# Patient Record
Sex: Female | Born: 1975 | Race: Black or African American | Hispanic: No | Marital: Married | Smoking: Never smoker
Health system: Southern US, Community
[De-identification: ages and names within clinical notes are randomized; demographics above are authoritative.]

## PROBLEM LIST (undated history)

## (undated) DIAGNOSIS — E119 Type 2 diabetes mellitus without complications: Secondary | ICD-10-CM

---

## 2018-07-18 ENCOUNTER — Encounter (HOSPITAL_COMMUNITY): Payer: Self-pay

## 2018-07-18 ENCOUNTER — Inpatient Hospital Stay (HOSPITAL_COMMUNITY): Payer: Medicaid Other

## 2018-07-18 ENCOUNTER — Inpatient Hospital Stay (HOSPITAL_COMMUNITY)
Admission: AD | Admit: 2018-07-18 | Discharge: 2018-07-18 | Disposition: A | Discharge status: Home / Self Care | Payer: Medicaid Other | Attending: Obstetrics and Gynecology | Admitting: Obstetrics and Gynecology

## 2018-07-18 DIAGNOSIS — M549 Dorsalgia, unspecified: Secondary | ICD-10-CM | POA: Diagnosis present

## 2018-07-18 DIAGNOSIS — O208 Other hemorrhage in early pregnancy: Secondary | ICD-10-CM | POA: Insufficient documentation

## 2018-07-18 DIAGNOSIS — O468X1 Other antepartum hemorrhage, first trimester: Secondary | ICD-10-CM | POA: Diagnosis not present

## 2018-07-18 DIAGNOSIS — Z3A08 8 weeks gestation of pregnancy: Secondary | ICD-10-CM | POA: Insufficient documentation

## 2018-07-18 DIAGNOSIS — O200 Threatened abortion: Secondary | ICD-10-CM | POA: Diagnosis not present

## 2018-07-18 DIAGNOSIS — O418X10 Other specified disorders of amniotic fluid and membranes, first trimester, not applicable or unspecified: Secondary | ICD-10-CM

## 2018-07-18 DIAGNOSIS — O209 Hemorrhage in early pregnancy, unspecified: Secondary | ICD-10-CM | POA: Diagnosis not present

## 2018-07-18 DIAGNOSIS — O24911 Unspecified diabetes mellitus in pregnancy, first trimester: Secondary | ICD-10-CM | POA: Diagnosis not present

## 2018-07-18 DIAGNOSIS — N939 Abnormal uterine and vaginal bleeding, unspecified: Secondary | ICD-10-CM | POA: Diagnosis present

## 2018-07-18 HISTORY — DX: Type 2 diabetes mellitus without complications: E11.9

## 2018-07-18 LAB — URINALYSIS, ROUTINE W REFLEX MICROSCOPIC
Bacteria, UA: NONE SEEN
Bilirubin Urine: NEGATIVE
Glucose, UA: >=500 mg/dL — AB
KETONES UR: NEGATIVE mg/dL
Nitrite: NEGATIVE
PH: 6 (ref 5.0–8.0)
Protein, ur: 100 mg/dL — AB
Specific Gravity, Urine: 1.023 (ref 1.005–1.030)

## 2018-07-18 LAB — CBC
HEMATOCRIT: 41.7 % (ref 36.0–46.0)
Hemoglobin: 13.8 g/dL (ref 12.0–15.0)
MCH: 28.3 pg (ref 26.0–34.0)
MCHC: 33.1 g/dL (ref 30.0–36.0)
MCV: 85.6 fL (ref 80.0–100.0)
Platelets: 342 10*3/uL (ref 150–400)
RBC: 4.87 MIL/uL (ref 3.87–5.11)
RDW: 13.5 % (ref 11.5–15.5)
WBC: 7.4 10*3/uL (ref 4.0–10.5)
nRBC: 0 % (ref 0.0–0.2)

## 2018-07-18 LAB — WET PREP, GENITAL
Clue Cells Wet Prep HPF POC: NONE SEEN
Sperm: NONE SEEN
Trich, Wet Prep: NONE SEEN
Yeast Wet Prep HPF POC: NONE SEEN

## 2018-07-18 LAB — HCG, QUANTITATIVE, PREGNANCY: hCG, Beta Chain, Quant, S: 14825 m[IU]/mL — ABNORMAL HIGH (ref <5)

## 2018-07-18 LAB — ABO/RH: ABO/RH(D): A POS

## 2018-07-18 LAB — POCT PREGNANCY, URINE: Preg Test, Ur: POSITIVE — AB

## 2018-07-18 NOTE — MAU Provider Note (Signed)
Chief Complaint: Vaginal Bleeding and Back Pain   First Provider Initiated Contact with Patient 07/18/18 1136     SUBJECTIVE HPI: Gina Mcclure is a 42 y.o. [id number] at 7524w5d by unsure LMP who presents to [facility] reporting abdominal cramping & vaginal bleeding. Symptoms began yesterday. Initially was brown spotting but has turned red today. Not saturating pads or passing clots.   Location: abdomen & back Quality: cramping Severity: 5/10 on pain scale Duration: 1 day Timing: intremittent Modifying factors: none Associated signs and symptoms: vaginal bleeding  Past Medical History:  Diagnosis Date  . Diabetes mellitus without complication (HCC)    OB History  Gravida Para Term Preterm AB Living  6 4 4   1 4   SAB TAB Ectopic Multiple Live Births  1            # Outcome Date GA Lbr Len/2nd Weight Sex Delivery Anes PTL Lv  6 Current           5 SAB           4 Term      Vag-Spont     3 Term      Vag-Spont     2 Term      Vag-Spont     1 Term      CS-Unspec      Past Surgical History:  Procedure Laterality Date  . CESAREAN SECTION     Social History   Socioeconomic History  . Marital status: Married    Spouse name: Not on file  . Number of children: Not on file  . Years of education: Not on file  . Highest education level: Not on file  Occupational History  . Not on file  Social Needs  . Financial resource strain: Not on file  . Food insecurity:    Worry: Not on file    Inability: Not on file  . Transportation needs:    Medical: Not on file    Non-medical: Not on file  Tobacco Use  . Smoking status: Never Smoker  . Smokeless tobacco: Never Used  Substance and Sexual Activity  . Alcohol use: Not Currently  . Drug use: Never  . Sexual activity: Not on file  Lifestyle  . Physical activity:    Days per week: Not on file    Minutes per session: Not on file  . Stress: Not on file  Relationships  . Social connections:    Talks on phone: Not on file   Gets together: Not on file    Attends religious service: Not on file    Active member of club or organization: Not on file    Attends meetings of clubs or organizations: Not on file    Relationship status: Not on file  . Intimate partner violence:    Fear of current or ex partner: Not on file    Emotionally abused: Not on file    Physically abused: Not on file    Forced sexual activity: Not on file  Other Topics Concern  . Not on file  Social History Narrative  . Not on file   No family history on file. No current facility-administered medications on file prior to encounter.    No current outpatient medications on file prior to encounter.   No Known Allergies  I have reviewed patient's Past Medical Hx, Surgical Hx, Family Hx, Social Hx, medications and allergies.   Review of Systems  Constitutional: Negative.   Gastrointestinal: Positive for abdominal pain.  Genitourinary: Positive for vaginal bleeding.  Musculoskeletal: Positive for back pain.    OBJECTIVE Patient Vitals for the past 24 hrs:  BP Temp Pulse Resp SpO2  07/18/18 1141 136/88 (!) 97.5 F (36.4 C) 85 20 99 %   Constitutional: Well-developed, well-nourished female in no acute distress.  Cardiovascular: normal rate & rhythm, no murmur Respiratory: normal rate and effort. Lung sounds clear throughout GI: Abd soft, non-tender, Pos BS x 4. No guarding or rebound tenderness MS: Extremities nontender, no edema, normal ROM Neurologic: Alert and oriented x 4.  GU:     SPECULUM EXAM: NEFG, small amount of dark red blood. Cervix pink/smooth.   BIMANUAL: No CMT. cervix closed; uterus normal size, no adnexal tenderness or masses.    LAB RESULTS Results for orders placed or performed during the hospital encounter of 07/18/18 (from the past 24 hour(s))  Urinalysis, Routine w reflex microscopic     Status: Abnormal   Collection Time: 07/18/18 11:24 AM  Result Value Ref Range   Color, Urine RED (A) YELLOW   APPearance  CLOUDY (A) CLEAR   Specific Gravity, Urine 1.023 1.005 - 1.030   pH 6.0 5.0 - 8.0   Glucose, UA >=500 (A) NEGATIVE mg/dL   Hgb urine dipstick LARGE (A) NEGATIVE   Bilirubin Urine NEGATIVE NEGATIVE   Ketones, ur NEGATIVE NEGATIVE mg/dL   Protein, ur 161100 (A) NEGATIVE mg/dL   Nitrite NEGATIVE NEGATIVE   Leukocytes, UA SMALL (A) NEGATIVE   RBC / HPF >50 (H) 0 - 5 RBC/hpf   WBC, UA 0-5 0 - 5 WBC/hpf   Bacteria, UA NONE SEEN NONE SEEN   Squamous Epithelial / LPF 0-5 0 - 5   Mucus PRESENT   Pregnancy, urine POC     Status: Abnormal   Collection Time: 07/18/18 11:27 AM  Result Value Ref Range   Preg Test, Ur POSITIVE (A) NEGATIVE  Wet prep, genital     Status: Abnormal   Collection Time: 07/18/18 11:54 AM  Result Value Ref Range   Yeast Wet Prep HPF POC NONE SEEN NONE SEEN   Trich, Wet Prep NONE SEEN NONE SEEN   Clue Cells Wet Prep HPF POC NONE SEEN NONE SEEN   WBC, Wet Prep HPF POC FEW (A) NONE SEEN   Sperm NONE SEEN   CBC     Status: None   Collection Time: 07/18/18 12:06 PM  Result Value Ref Range   WBC 7.4 4.0 - 10.5 K/uL   RBC 4.87 3.87 - 5.11 MIL/uL   Hemoglobin 13.8 12.0 - 15.0 g/dL   HCT 09.641.7 04.536.0 - 40.946.0 %   MCV 85.6 80.0 - 100.0 fL   MCH 28.3 26.0 - 34.0 pg   MCHC 33.1 30.0 - 36.0 g/dL   RDW 81.113.5 91.411.5 - 78.215.5 %   Platelets 342 150 - 400 K/uL   nRBC 0.0 0.0 - 0.2 %  ABO/Rh     Status: None (Preliminary result)   Collection Time: 07/18/18 12:06 PM  Result Value Ref Range   ABO/RH(D)      A POS Performed at [facility], [address] [place], [place] [id number]   hCG, quantitative, pregnancy     Status: Abnormal   Collection Time: 07/18/18 12:06 PM  Result Value Ref Range   hCG, Beta Chain, Quant, S 14,825 (H) <5 mIU/mL    IMAGING Kore[place] Ob Less Than 14 Weeks With Ob Transvaginal  Result Date: 07/18/2018 CLINICAL DATA:  Pregnant patient with vaginal bleeding. EXAM: OBSTETRIC <14  WK [place] AND TRANSVAGINAL OB US TECHNIQUE: Both transabdominal and transvaginal  ultrasound examinations were performed for complete evaluation of the gestation as well as the maternal uterus, adnexal regions, and pelvic cul-de-sac. Transvaginal technique was performed to assess early pregnancy. COMPARISON:  None. FINDINGS: Intrauterine gestational sac: Single Yolk sac:  Visualized. Embryo:  Visualized. Cardiac Activity: Not Visualized. CRL:  2.9 mm   5 w   5 d Subchorionic hemorrhage:  Small Maternal uterus/adnexae: Normal right and left ovaries. No free fluid in the pelvis. IMPRESSION: Intrauterine gestational sac and fetal pole identified without heartbeat. Crown-rump length of 2.9 mm. Findings are suspicious but not yet definitive for failed pregnancy. Recommend follow-up US in 10-14 days for definitive diagnosis. This recommendation follows SRU consensus guidelines: Diagnostic Criteria for Nonviable Pregnancy Early in the First Trimester. Malva Limes Med 2013; 409:8119-14. Small subchorionic hemorrhage. Electronically Signed   By: Annia Belt M.D.   On: 07/18/2018 13:13    MAU COURSE Orders Placed This Encounter  Procedures  . Wet prep, genital  . US OB LESS THAN 14 WEEKS WITH OB TRANSVAGINAL  . US OB Transvaginal  . Urinalysis, Routine w reflex microscopic  . CBC  . hCG, quantitative, pregnancy  . HIV Antibody (routine testing w rflx)  . Pregnancy, urine POC  . ABO/Rh  . Discharge patient   No orders of the defined types were placed in this encounter.   MDM +UPT UA, wet prep, GC/chlamydia, CBC, ABO/Rh, quant hCG, and [place] today to rule out ectopic pregnancy Ultrasound shows IUP, embryo w/CRL of 2.9 mm & no cardiac activity. Small SCH.  Discussed results with patient & spouse using video Arabic interpreter. Pt doesn't know date of LMP but sure that it was 2 months ago. Concerning for miscarriage but will get f/u ultrasound for viability.   ASSESSMENT 1. Threatened miscarriage   2. Vaginal bleeding in pregnancy, first trimester   3. Subchorionic hematoma in first  trimester, single or unspecified fetus     PLAN Discharge home in stable condition. Bleeding precautions Outpatient f/u ultrasound ordered  Allergies as of 07/18/2018   No Known Allergies     Medication List    You have not been prescribed any medications.      Judeth Horn, NP 07/18/2018  1:55 PM

## 2018-07-18 NOTE — MAU Note (Signed)
Reports she started bleeding yesterday, now it is brown. Reports it is just a little bit  Reports some back pain  LMP unknown, thinks she is 2 months pregnant

## 2018-07-18 NOTE — Discharge Instructions (Signed)
Subchorionic Hematoma ° °A subchorionic hematoma is a gathering of blood between the outer wall of the embryo (chorion) and the inner wall of the womb (uterus). °This condition can cause vaginal bleeding. If they cause little or no vaginal bleeding, early small hematomas usually shrink on their own and do not affect your baby or pregnancy. When bleeding starts later in pregnancy, or if the hematoma is larger or occurs in older pregnant women, the condition may be more serious. Larger hematomas may get bigger, which increases the chances of miscarriage. This condition also increases the risk of: °· Premature separation of the placenta from the uterus. °· Premature (preterm) labor. °· Stillbirth. °What are the causes? °The exact cause of this condition is not known. It occurs when blood is trapped between the placenta and the uterine wall because the placenta has separated from the original site of implantation. °What increases the risk? °You are more likely to develop this condition if: °· You were treated with fertility medicines. °· You conceived through in vitro fertilization (IVF). °What are the signs or symptoms? °Symptoms of this condition include: °· Vaginal spotting or bleeding. °· Contractions of the uterus. These cause abdominal pain. °Sometimes you may have no symptoms and the bleeding may only be seen when ultrasound images are taken (transvaginal ultrasound). °How is this diagnosed? °This condition is diagnosed based on a physical exam. This includes a pelvic exam. You may also have other tests, including: °· Blood tests. °· Urine tests. °· Ultrasound of the abdomen. °How is this treated? °Treatment for this condition can vary. Treatment may include: °· Watchful waiting. You will be monitored closely for any changes in bleeding. During this stage: °? The hematoma may be reabsorbed by the body. °? The hematoma may separate the fluid-filled space containing the embryo (gestational sac) from the wall of the  womb (endometrium). °· Medicines. °· Activity restriction. This may be needed until the bleeding stops. °Follow these instructions at home: °· Stay on bed rest if told to do so by your health care provider. °· Do not lift anything that is heavier than 10 lbs. (4.5 kg) or as told by your health care provider. °· Do not use any products that contain nicotine or tobacco, such as cigarettes and e-cigarettes. If you need help quitting, ask your health care provider. °· Track and write down the number of pads you use each day and how soaked (saturated) they are. °· Do not use tampons. °· Keep all follow-up visits as told by your health care provider. This is important. Your health care provider may ask you to have follow-up blood tests or ultrasound tests or both. °Contact a health care provider if: °· You have any vaginal bleeding. °· You have a fever. °Get help right away if: °· You have severe cramps in your stomach, back, abdomen, or pelvis. °· You pass large clots or tissue. Save any tissue for your health care provider to look at. °· You have more vaginal bleeding, and you faint or become lightheaded or weak. °Summary °· A subchorionic hematoma is a gathering of blood between the outer wall of the placenta and the uterus. °· This condition can cause vaginal bleeding. °· Sometimes you may have no symptoms and the bleeding may only be seen when ultrasound images are taken. °· Treatment may include watchful waiting, medicines, or activity restriction. °This information is not intended to replace advice given to you by your health care provider. Make sure you discuss any questions you   have with your health care provider. °Document Released: 10/22/2006 Document Revised: 09/02/2016 Document Reviewed: 09/02/2016 °Elsevier Interactive Patient Education © 2019 Elsevier Inc. °Threatened Miscarriage ° °A threatened miscarriage occurs when a woman has vaginal bleeding during the first 20 weeks of pregnancy but the pregnancy has  not ended. If you have vaginal bleeding during this time, your health care provider will do tests to make sure you are still pregnant. If the tests show that you are still pregnant and that the developing baby (fetus) inside your uterus is still growing, your condition is considered a threatened miscarriage. °A threatened miscarriage does not mean your pregnancy will end, but it does increase the risk of losing your pregnancy (complete miscarriage). °What are the causes? °The cause of this condition is usually not known. For women who go on to have a complete miscarriage, the most common cause is an abnormal number of chromosomes in the developing baby. Chromosomes are the structures inside cells that hold all of a person's genetic material. °What increases the risk? °The following lifestyle factors may increase your risk of a miscarriage in early pregnancy: °· Smoking. °· Drinking excessive amounts of alcohol or caffeine. °· Recreational drug use. °The following preexisting health conditions may increase your risk of a miscarriage in early pregnancy: °· Polycystic ovary syndrome. °· Uterine fibroids. °· Infections. °· Diabetes mellitus. °What are the signs or symptoms? °Symptoms of this condition include: °· Vaginal bleeding. °· Mild abdominal pain or cramps. °How is this diagnosed? °If you have bleeding with or without abdominal pain before 20 weeks of pregnancy, your health care provider will do tests to check whether you are still pregnant. These will include: °· Ultrasound. This test uses sound waves to create images of the inside of your uterus. This allows your health care provider to look at your developing baby and other structures, such as your placenta. °· Pelvic exam. This is an internal exam of your vagina and cervix. °· Measurement of your baby's heart rate. °· Laboratory tests such as blood tests, urine tests, or swabs for infection °You may be diagnosed with a threatened miscarriage if: °· Ultrasound  testing shows that you are still pregnant. °· Your baby’s heart rate is strong. °· A pelvic exam shows that the opening between your uterus and your vagina (cervix) is closed. °· Blood tests confirm that you are still pregnant. °How is this treated? °No treatments have been shown to prevent a threatened miscarriage from going on to a complete miscarriage. However, the right home care is important. °Follow these instructions at home: °· Get plenty of rest. °· Do not have sex or use tampons if you have vaginal bleeding. °· Do not douche. °· Do not smoke or use recreational drugs. °· Do not drink alcohol. °· Avoid caffeine. °· Keep all follow-up prenatal visits as told by your health care provider. This is important. °Contact a health care provider if: °· You have light vaginal bleeding or spotting while pregnant. °· You have abdominal pain or cramping. °· You have a fever. °Get help right away if: °· You have heavy vaginal bleeding. °· You have blood clots coming from your vagina. °· You pass tissue from your vagina. °· You leak fluid, or you have a gush of fluid from your vagina. °· You have severe low back pain or abdominal cramps. °· You have fever, chills, and severe abdominal pain. °Summary °· A threatened miscarriage occurs when a woman has vaginal bleeding during the first 20 weeks of   pregnancy but the pregnancy has not ended. °· The cause of a threatened miscarriage is usually not known. °· Symptoms of this condition may include vaginal bleeding and mild abdominal pain or cramps. °· No treatments have been shown to prevent a threatened miscarriage from going on to a complete miscarriage. °· Keep all follow-up prenatal visits as told by your health care provider. This is important. °This information is not intended to replace advice given to you by your health care provider. Make sure you discuss any questions you have with your health care provider. °Document Released: 07/07/2005 Document Revised: 10/03/2016  Document Reviewed: 10/03/2016 °Elsevier Interactive Patient Education © 2019 Elsevier Inc. ° °

## 2018-07-19 LAB — GC/CHLAMYDIA PROBE AMP (~~LOC~~) NOT AT ARMC
Chlamydia: NEGATIVE
Neisseria Gonorrhea: NEGATIVE

## 2018-07-19 LAB — HIV ANTIBODY (ROUTINE TESTING W REFLEX): HIV Screen 4th Generation wRfx: NONREACTIVE

## 2018-07-24 ENCOUNTER — Inpatient Hospital Stay (HOSPITAL_COMMUNITY)
Admission: AD | Admit: 2018-07-24 | Discharge: 2018-07-24 | Disposition: A | Discharge status: Home / Self Care | Payer: Medicaid Other | Attending: Obstetrics & Gynecology | Admitting: Obstetrics & Gynecology

## 2018-07-24 ENCOUNTER — Inpatient Hospital Stay (HOSPITAL_COMMUNITY): Payer: Medicaid Other

## 2018-07-24 ENCOUNTER — Encounter (HOSPITAL_COMMUNITY): Payer: Self-pay | Admitting: *Deleted

## 2018-07-24 DIAGNOSIS — Z3A09 9 weeks gestation of pregnancy: Secondary | ICD-10-CM | POA: Diagnosis not present

## 2018-07-24 DIAGNOSIS — O209 Hemorrhage in early pregnancy, unspecified: Secondary | ICD-10-CM | POA: Diagnosis not present

## 2018-07-24 DIAGNOSIS — O034 Incomplete spontaneous abortion without complication: Secondary | ICD-10-CM | POA: Diagnosis not present

## 2018-07-24 DIAGNOSIS — O208 Other hemorrhage in early pregnancy: Secondary | ICD-10-CM | POA: Diagnosis present

## 2018-07-24 LAB — CBC
HCT: 38.5 % (ref 36.0–46.0)
Hemoglobin: 12.7 g/dL (ref 12.0–15.0)
MCH: 28.5 pg (ref 26.0–34.0)
MCHC: 33 g/dL (ref 30.0–36.0)
MCV: 86.3 fL (ref 80.0–100.0)
Platelets: 311 10*3/uL (ref 150–400)
RBC: 4.46 MIL/uL (ref 3.87–5.11)
RDW: 13.2 % (ref 11.5–15.5)
WBC: 6.4 10*3/uL (ref 4.0–10.5)
nRBC: 0 % (ref 0.0–0.2)

## 2018-07-24 LAB — HCG, QUANTITATIVE, PREGNANCY: hCG, Beta Chain, Quant, S: 5910 m[IU]/mL — ABNORMAL HIGH (ref <5)

## 2018-07-24 MED ORDER — PROMETHAZINE HCL 25 MG PO TABS: 12.5000 mg | ORAL_TABLET | Freq: Four times a day (QID) | ORAL | 0 refills | Status: AC | PRN

## 2018-07-24 MED ORDER — TRAMADOL HCL 50 MG PO TABS: 50.0000 mg | ORAL_TABLET | Freq: Four times a day (QID) | ORAL | 0 refills | Status: AC | PRN

## 2018-07-24 MED ORDER — MISOPROSTOL 200 MCG PO TABS
800.0000 ug | ORAL_TABLET | Freq: Once | ORAL | Status: AC
  Administered 2018-07-24: 800 ug via BUCCAL
  Filled 2018-07-24: qty 4

## 2018-07-24 NOTE — MAU Note (Addendum)
Gina Mcclure is a 43 y.o. at 4282w4d here in MAU reporting: +vaginal bleeding. Same as a period. Has continued since being seen. Was told to come back if it worsened. Onset of complaint: since 12/29 visit. Pain score: denies.  Vitals:   07/24/18 1109  BP: 112/80  Pulse: 85  Resp: 18  Temp: 98.3 F (36.8 C)  SpO2: 100%    Lab orders placed from triage: ua  Video interpretor used; Arabic: Erenu [phone]

## 2018-07-24 NOTE — Discharge Instructions (Signed)
()     .             ( )  .          .           .      .                          .        :              .                       ( ).     .        .                 (  ).                          ( ).   .       .  ().         .         :            .        .         .           :      .              :   (D&C).               ( )      .   : medicine    . ?         . ?   ()  .          Marland Kitchen       Rh     Rh        Rh immunoglobulinto         Rh.   "Rh-negative"  "Rh-positive"               .     :    -               .                   Marland Kitchen           .               .      .    .          .            .            ().   . -           .        .                .                      .                               .          .  .          :  [email or web address]          : [email or web address]     :    .     .      :         .         ( )  .              .     .  Marland Kitchen              .              ( )  .                  .          .  'iijhad ghyr kamil al'iijhad hu fiqdan aljiniyn (aljnyn) qabl al'usbue aleishrin min alhaml. fi halat 'iijhad ghyr muktamalat , tabqaa 'ajza' min aljiniyn 'aw almushima (bead alwiladata) fi aljism. tahadath mezm halat al'iijhad fi al'ashhur althlatht al'uwlaa min alhumli. yahduth dhlk fi bed al'ahyan qabl 'an tuearif almar'at 'anaha hamil. ymkn 'an yakun al'iijhad tajribat eatifiatin. 'iidha kunt qad taearadat lil'iijhad , fatahadath mae muqadim alrieayat alsihiyat alkhasi bik ean 'ayi 'asyilat qad takun ladaykum hawl al'iijhad waeamaliat alhuzn wakhutat alhaml almustaqbaliat. ma hi al'asbaba? qad yakun sabab hadhih alhalata:  mashakil aljayinat 'aw alkurumusumat alty tajeal min almustahil ealaa altifl altatawur bishakl tabiei. ghalbana ma takun hadhih almushkilat natijatan li'akhta' eushwayiyat tahadath fi marhalat mubakirat min alnumui , wala ytmu naqluha min al'ab 'iilaa altifl (ghyir almawruthat). 'iisabatan eunq alrahim 'aw alruhm. alhalat alty tuathir ealaa tawazun alhirmunat fi aljism.  mashakil fi eunq alrahim , mithl fath eunq alrahim waraqiqih qabl alhaml hi fi nihayatiha (qsuwr einq alrahm).  mashakil fi alrahim , mithl alrahim dhi alshakl ghyr altabieii , 'aw al'awram alliyfiat fi alrahim , 'aw almashakil alty kanat mawjudatan mundh alwilada (altashawuhat alkhalqiata). bed alhalat altabiyati. altadakhiyn 'aw shurb alkuhul 'aw taeati almukhdirat. 'iisaba (sdimat). fi kthyr min al'ahyan , sbb al'iijhad ghyr maeruf. ma hi alealamat 'aw al'aerad? tashmal 'aerad hadhih alhalat: nazif muhbiliun 'aw tahdid Reatha Armour 'aw bidun tashanujat 'aw 'alm. 'alam 'aw tashnuj fi albatn 'aw 'asfal alzuhra. murur alsawayil 'aw al'ansajat 'aw jultat aldam min  almahbil. kayf ytmu tashkhis hdha? qad yatim tashkhis hadhih alhalat bna'an ealaa: alfahs albadaniu  almawajaat fawq alsawtiat. aikhtibarat aldam aikhtibarat albawl kayf ytmu eilaj hdha? yumkin eilaj al'iijhad ghyr almuktamal b: altamadud walkushut (D&C). hadha hu al'iijra' aldhy ytmu fih fath eunq alrahim , wayutimu kasht bitanat alrahim (btanat alrahma) li'iizalat 'ayi nasij mtbq min alhaml. al'adwiat , mthl: medicine mdadat hayawiat liealaj aleadwaa. ? dawa' limusaeadat 'ayi nasij mtbq ealaa alkhuruj min alruhm. ? dawa' likhafd (tqls) hajm alrahm. yumkin 'iieta' hadhih al'adwiat 'iidha kan ladayk alkthyr min alnazif. 'iidha kan ladayk dam salbi bsbb Rh wakan tiflak 'iyjabyana fi Rh , fisitihtaj 'iilaa jureatan min aldiwa' tusamaa Rh immunoglobulinto tahmi al'atfal fi almustaqbal min mashakil dam Rh. yushir almustalah "Rh-negative" w "Rh-positive" 'iilaa ma 'iidha kan lildam burwatayn muhadad mawjud ealaa sath khalaya aldam alhamra' 'am la. atabie hadhih altaelimat fi almanzil: al'adwia - tanawul al'adwiat alty tasrif bidun wasfat tibiyat wal'adwiat almawsufat faqat kama qal muqadim alrieayat alsahiyat. 'iidha kunt tatanawal dwa'an mdadana lilmadadat alhayawiat , khudha almadadi alhayawiu alkhasi bik wfqana lma qalah mufir alrieayat alsihiati. la tatawaqaf ean tanawul almidadaat alhayawiat hataa law bada'at tasheur bialtahsun. la takhudh mdadat alailtihab ghyr alsitirawyiydiat , mithl al'asbarin wal'iibubirufin , 'illa 'iidha wafaq tabibik. hadhih al'adwiat ymkn 'an tusabib alnazif. nashat alrrahat hsb altawjihat. ais'al muqadim alrieayat  alsihiyat alkhasi bik ean al'anshitat alaminat lika.  'atlub musaeadat shakhs ma fi maswuwaliaat almanzil wal'usrat khilal hdha alwaqt. taelimat eama tatabie eadad alfawt alsihiyat alty tastakhdimuha ywmyana wamadaa ghamariha (almashbeata). aiktub hadhih almaelumat. - raqib kamiyat al'ansijat 'aw jultat aldam alty tamuru biha min almahbili. qum bihafz 'ayi kamiyat kabirat min  al'ansajat lifahsiha. la tastakhdim hafayiz , nadah , 'aw mumarasat aljins hataa ywafq muqadim alrieayat alsihiyat alkhasi bika.  limusaeadatik washarikik fi eamaliat alhuzn , tahadath mae muqadim alrieayat alsihiyat 'aw 'abhath ean almashurat lilmusaeadat fi altaghalub ealaa fiqdan alhaml. eindama Janeece Fittingtakun mstedana , aijtamae mae muqadim alrieayat alsihiyat alkhasi bik limunaqashat alkhutuat almuhimat alty yjb ealayk aitikhadhuha lisuhtik , wakadhlik alkhutwat alati yjbu aitikhadhuha min ajl alhaml alsihiyi fi almustaqbal. aihtafaz bijmie ziarat almutabaeat wfqana lma qalah muqadim alrieayat alsahiati. hdha muhim. 'ayn tajid almazid min almaelumat almutamar al'amrikiu li'atbaa' alnisa' waltawlid: www.[email or web address]  alwilayat almutahidat wizarat alsihat walkhadamat al'iinsaniat maktab sihat almara'at: [email or web address] 'atasil bimuzud alrieayat alsihiyat 'idha: ladayk humaa 'aw qasheariratan. ladayk 'iifrazat muhbaliat karihat alraayihat. ahsil ealaa almusaeadat ealaa alfawr 'idha: ladayk tashnajat 'aw 'alam shadid fi alzuhr 'aw albatna. 'ant tamarir julatat damawiatan 'aw 'ansijat bihajm aljawz (aw 'akbara) min muhbalik.  ladayk nazif hadun , tanaqie 'akthar min wasadat sihiyat muntazimat fi saeat wahidatin. 'ant tusbih Clover Mealymanaratan 'aw daeifatan. 'ant tamut.  ladayk mashaeir hazn tastahwidh ealaa 'afkarik , 'aw ladayk 'afkar tudhi nafsik. malkhas fi halat 'iijhad ghyr muktamalat , tabqaa 'ajza' min aljinin 'aw almushima (bead alwiladat) fi aljism.  tujad khiarat mutaeadidat lieilaj al'iijhad ghyr almuktamal , waltahaduth 'iilaa muqadim alrieayat alsihiyat ean alkhiar al'afdal lika.  atabae taelimat muqadim alrieayat alsihiyat alkhasat bik lirieayat almutabaeat.

## 2018-07-24 NOTE — MAU Provider Note (Signed)
Chief Complaint: Vaginal Bleeding   First Provider Initiated Contact with Patient 07/24/18 1153      SUBJECTIVE HPI: Gina Mcclure is a 43 y.o. [id number] at [redacted]w[redacted]d by LMP who presents to maternity admissions reporting increased vaginal bleeding since her MAU visit on 07/18/18. She presented on 07/18/18 with abdominal cramping and bleeding. She had quant hcg of [id number] and [place] that showed IUP with fetal pole at [redacted]w[redacted]d with no visible FHR.  She has outpatient Korea on 07/30/18 for viability.  Her cramping has continued, mild lower abdominal cramping that has not required treatment. Her bleeding was scant but is now like a light period, requiring a pad, changed 4-5 times per day.  There are no other symptoms. She has not tried any treatments.    HPI  Past Medical History:  Diagnosis Date  . Diabetes mellitus without complication [facility])    Past Surgical History:  Procedure Laterality Date  . CESAREAN SECTION     Social History   Socioeconomic History  . Marital status: Married    Spouse name: Not on file  . Number of children: Not on file  . Years of education: Not on file  . Highest education level: Not on file  Occupational History  . Not on file  Social Needs  . Financial resource strain: Not on file  . Food insecurity:    Worry: Not on file    Inability: Not on file  . Transportation needs:    Medical: Not on file    Non-medical: Not on file  Tobacco Use  . Smoking status: Never Smoker  . Smokeless tobacco: Never Used  Substance and Sexual Activity  . Alcohol use: Not Currently  . Drug use: Never  . Sexual activity: Not on file  Lifestyle  . Physical activity:    Days per week: Not on file    Minutes per session: Not on file  . Stress: Not on file  Relationships  . Social connections:    Talks on phone: Not on file    Gets together: Not on file    Attends religious service: Not on file    Active member of club or organization: Not on file    Attends meetings of clubs or  organizations: Not on file    Relationship status: Not on file  . Intimate partner violence:    Fear of current or ex partner: Not on file    Emotionally abused: Not on file    Physically abused: Not on file    Forced sexual activity: Not on file  Other Topics Concern  . Not on file  Social History Narrative  . Not on file   No current facility-administered medications on file prior to encounter.    No current outpatient medications on file prior to encounter.   No Known Allergies  ROS:  Review of Systems  Constitutional: Negative for chills, fatigue and fever.  Respiratory: Negative for shortness of breath.   Cardiovascular: Negative for chest pain.  Gastrointestinal: Positive for abdominal pain. Negative for nausea and vomiting.  Genitourinary: Positive for pelvic pain and vaginal bleeding. Negative for difficulty urinating, dysuria, flank pain, vaginal discharge and vaginal pain.  Neurological: Negative for dizziness and headaches.  Psychiatric/Behavioral: Negative.      I have reviewed patient's Past Medical Hx, Surgical Hx, Family Hx, Social Hx, medications and allergies.   Physical Exam   Patient Vitals for the past 24 hrs:  BP Temp Temp src Pulse Resp SpO2 Weight  07/24/18  1109 112/80 98.3 F (36.8 C) Oral 85 18 100 % 103 kg   Constitutional: Well-developed, well-nourished female in no acute distress.  Cardiovascular: normal rate Respiratory: normal effort GI: Abd soft, non-tender. Pos BS x 4 MS: Extremities nontender, no edema, normal ROM Neurologic: Alert and oriented x 4.  GU: Neg CVAT.  PELVIC EXAM: Deferred    LAB RESULTS Results for orders placed or performed during the hospital encounter of 07/24/18 (from the past 24 hour(s))  CBC     Status: None   Collection Time: 07/24/18 12:15 PM  Result Value Ref Range   WBC 6.4 4.0 - 10.5 K/uL   RBC 4.46 3.87 - 5.11 MIL/uL   Hemoglobin 12.7 12.0 - 15.0 g/dL   HCT 16.138.5 09.636.0 - 04.546.0 %   MCV 86.3 80.0 -  100.0 fL   MCH 28.5 26.0 - 34.0 pg   MCHC 33.0 30.0 - 36.0 g/dL   RDW 40.913.2 81.111.5 - 91.415.5 %   Platelets 311 150 - 400 K/uL   nRBC 0.0 0.0 - 0.2 %  hCG, quantitative, pregnancy     Status: Abnormal   Collection Time: 07/24/18 12:15 PM  Result Value Ref Range   hCG, Beta Chain, Quant, S 5,910 (H) <5 mIU/mL    --/--/A POS Performed at [facility], [address] [place], [place] [address]  (12/29 1206)  IMAGING Koreas Ob Transvaginal  Result Date: 07/24/2018 CLINICAL DATA:  Vaginal bleeding, early pregnancy EXAM: TRANSVAGINAL OB ULTRASOUND TECHNIQUE: Transvaginal ultrasound was performed for complete evaluation of the gestation as well as the maternal uterus, adnexal regions, and pelvic cul-de-sac. COMPARISON:  07/18/2018 FINDINGS: Intrauterine gestational sac: Absent Yolk sac:  Absent Embryo:  Absent Cardiac Activity: Absent Heart Rate: N/A bpm Mixed echogenicity material along the endometrium primarily compatible with blood products. Small amount of accentuated vascularity along some of the endometrial contents. These contents bulge down into the cervix. Maternal uterus/adnexae: The maternal adnexa could not be visualized as the patient was in considerable pain/discomfort which limited today's scan. IMPRESSION: 1. No recognizable gestational sac. Considerable mixed echogenicity material including hyperechoic material within the endometrium compatible with blood products. Overall appearance is not compatible with a viable pregnancy and represents spontaneous abortion in progress. Electronically Signed   By: Gaylyn RongWalter  Liebkemann M.D.   On: 07/24/2018 13:05   Koreas Ob Less Than 14 Weeks With Ob Transvaginal  Result Date: 07/18/2018 CLINICAL DATA:  Pregnant patient with vaginal bleeding. EXAM: OBSTETRIC <14 WK US AND TRANSVAGINAL OB US TECHNIQUE: Both transabdominal and transvaginal ultrasound examinations were performed for complete evaluation of the gestation as well as the maternal uterus, adnexal  regions, and pelvic cul-de-sac. Transvaginal technique was performed to assess early pregnancy. COMPARISON:  None. FINDINGS: Intrauterine gestational sac: Single Yolk sac:  Visualized. Embryo:  Visualized. Cardiac Activity: Not Visualized. CRL:  2.9 mm   5 w   5 d Subchorionic hemorrhage:  Small Maternal uterus/adnexae: Normal right and left ovaries. No free fluid in the pelvis. IMPRESSION: Intrauterine gestational sac and fetal pole identified without heartbeat. Crown-rump length of 2.9 mm. Findings are suspicious but not yet definitive for failed pregnancy. Recommend follow-up US in 10-14 days for definitive diagnosis. This recommendation follows SRU consensus guidelines: Diagnostic Criteria for Nonviable Pregnancy Early in the First Trimester. Malva Limes Engl J Med 2013; 621:3086-57; 369:1443-51. Small subchorionic hemorrhage. Electronically Signed   By: Annia Beltrew  Davis M.D.   On: 07/18/2018 13:13    MAU Management/MDM: Hcg level dropped from 14,825  to 5,910 and US confirms  that the IUP visible on 12/29 is no longer present but there are likely POCs in the uterus.  Discussed options for management including expectant management, Cytotec, and D&C.  Arabic interpreter via video line used for all communication.  Pt and her husband prefer Cytotec now. They would prefer the dose be given in MAU today vs picking up the medication at the pharmacy.  Cytotec 800 buccal dose given.  Rx for Tramadol and Phenergan PRN.  F/u in [facility] office in 1 week for hcg and 2 weeks for provider visit.  Return to MAU with heavy bleeding or severe pain.  Pt discharged with strict return precautions.  Maternity Admissions Unit Cytotec for Early Pregnancy Failure Checklist  Baseline Labs and Diagnostics . CBC . Ultrasound  . Quantitative BHCG . ABO/Rh  Standard Methods of Cytotec Administration:  MAU provider will prescribed ONE method o Cytotec 800 mcg: intravaginal (preferred)  to be administered by patient at home  o Cytotec 800 mcg:  intravaginal (preferred)  to be administered by RN while in MAU o Cytotec 800 mcg: rectally to be administered by patient at home o Cytotec 800 mcg: rectally  to be administered by RN while in MAU o Cytotec 600 mcg: orally to be administered by RN while in MAU o Cytotec 600 mcg: orally to be administered by patient at home  Additional Medications  o Ibuprofen  o Percocet or Vicodin o Phenergan o Rhophylac if indicated  MAU provider Patient counseling regarding all appropriate treatment options  MAU provider Assure patient is reliable for self-administration and/or return for unexpected bleeding, pain, and follow up visit  MAU provider Patient counseling regarding Cytotec expected action, self-administration, expected bleeding and discomfort, side effects.  Instruct patient if bleeding has not occurred in 12 hours another dose may be repeated at home (max 1600 mcg/24hrs).  If patient still has not bled within one week instruct them to call the clinic for repeat RX - [phone]  MAU provider Instructions for problems/emergencies prior to GYN clinic appointment  MAU provider Patient education on pain medication, anti-inflammatories and/or anti-emetics  MAU provider if Patient will self-administer Obtain informed consent for "Self-administration of Cytotec for management of early pregnancy failure"   MAU if RN to administer Obtain informed consent for "Administration of Cytotec for management of early pregnancy failure"  MAU provider Order Rhophylac as indicated  MAU provider Communicate with GYN clinic for 2-week follow up visit   MAU RN Witness informed consent  MAU RN Administer Cytotec as ordered (if patient does not want to self-administer Cytotec)  MAU RN Administer Rhophylac as ordered  MAU RN Use Teach Back-Ask Me 3-- Cytotec actions, bleeding, pain, follow-up, when to repeat cytotec, when to call clinic, and when to return to MAU prior to GYN clinic visit    ASSESSMENT 1. Vaginal  bleeding in pregnancy, first trimester   2. Incomplete miscarriage     PLAN Discharge home Allergies as of 07/24/2018   No Known Allergies     Medication List    TAKE these medications   promethazine 25 MG tablet Commonly known as:  PHENERGAN Take 0.5-1 tablets (12.5-25 mg total) by mouth every 6 (six) hours as needed for nausea.   traMADol 50 MG tablet Commonly known as:  ULTRAM Take 1 tablet (50 mg total) by mouth every 6 (six) hours as needed.      Follow-up Information    [facility].   Specialty:  Obstetrics and Gynecology Why:  The office will call you for labs in 1 week and visit with doctor/midwife in 2 weeks. Return to MAU for emergencies. Contact information: [address] [place] [id number] [phone]          Sharen CounterLisa Leftwich-Kirby Certified Nurse-Midwife 07/24/2018  2:39 PM

## 2018-07-29 ENCOUNTER — Telehealth: Payer: Self-pay | Admitting: Obstetrics and Gynecology

## 2018-07-29 NOTE — Telephone Encounter (Signed)
I called the patient to setup the appointment however there was no option to leave a voicemail and the patient did not answer. I made 3 call attempts with the interrupter. The appointments are scheduled and letter reminders are mailed.

## 2018-07-30 ENCOUNTER — Ambulatory Visit (HOSPITAL_COMMUNITY): Payer: Medicaid Other | Attending: Student

## 2018-07-30 ENCOUNTER — Other Ambulatory Visit: Payer: Self-pay | Admitting: *Deleted

## 2018-07-30 DIAGNOSIS — O034 Incomplete spontaneous abortion without complication: Secondary | ICD-10-CM

## 2018-08-02 ENCOUNTER — Other Ambulatory Visit: Payer: Medicaid Other

## 2018-08-09 ENCOUNTER — Ambulatory Visit: Payer: Medicaid Other | Admitting: Obstetrics & Gynecology

## 2018-08-27 ENCOUNTER — Ambulatory Visit: Payer: Medicaid Other | Admitting: Advanced Practice Midwife

## 2018-10-17 ENCOUNTER — Emergency Department (HOSPITAL_COMMUNITY)
Admission: EM | Admit: 2018-10-17 | Discharge: 2018-10-17 | Disposition: A | Discharge status: Home / Self Care | Payer: Medicaid Other | Attending: Emergency Medicine | Admitting: Emergency Medicine

## 2018-10-17 ENCOUNTER — Other Ambulatory Visit: Payer: Self-pay

## 2018-10-17 ENCOUNTER — Encounter: Payer: Self-pay | Admitting: Emergency Medicine

## 2018-10-17 DIAGNOSIS — N939 Abnormal uterine and vaginal bleeding, unspecified: Secondary | ICD-10-CM | POA: Diagnosis not present

## 2018-10-17 DIAGNOSIS — E119 Type 2 diabetes mellitus without complications: Secondary | ICD-10-CM | POA: Insufficient documentation

## 2018-10-17 DIAGNOSIS — Z760 Encounter for issue of repeat prescription: Secondary | ICD-10-CM | POA: Insufficient documentation

## 2018-10-17 DIAGNOSIS — Z789 Other specified health status: Secondary | ICD-10-CM

## 2018-10-17 LAB — CBC
HCT: 35.1 % — ABNORMAL LOW (ref 36.0–46.0)
Hemoglobin: 10.6 g/dL — ABNORMAL LOW (ref 12.0–15.0)
MCH: 25.4 pg — ABNORMAL LOW (ref 26.0–34.0)
MCHC: 30.2 g/dL (ref 30.0–36.0)
MCV: 84.2 fL (ref 80.0–100.0)
NRBC: 0 % (ref 0.0–0.2)
Platelets: 367 10*3/uL (ref 150–400)
RBC: 4.17 MIL/uL (ref 3.87–5.11)
RDW: 12.2 % (ref 11.5–15.5)
WBC: 6.3 10*3/uL (ref 4.0–10.5)

## 2018-10-17 LAB — I-STAT BETA HCG BLOOD, ED (MC, WL, AP ONLY): I-stat hCG, quantitative: <5 m[IU]/mL (ref <5)

## 2018-10-17 LAB — CBG MONITORING, ED: Glucose-Capillary: 133 mg/dL — ABNORMAL HIGH (ref 70–99)

## 2018-10-17 MED ORDER — FERROUS SULFATE 325 (65 FE) MG PO TABS: 325.0000 mg | ORAL_TABLET | Freq: Every day | ORAL | 0 refills | Status: AC

## 2018-10-17 MED ORDER — METFORMIN HCL 500 MG PO TABS: 500.0000 mg | ORAL_TABLET | Freq: Two times a day (BID) | ORAL | 2 refills | Status: AC

## 2018-10-17 NOTE — ED Notes (Signed)
Called pt husband. Dr Rosalia Hammers is speaking with him at this time.

## 2018-10-17 NOTE — ED Provider Notes (Signed)
[facility] EMERGENCY DEPARTMENT Provider Note   CSN: [id number] Arrival date & time: 10/17/18  1132    History   Chief Complaint Chief Complaint  Patient presents with  . Vaginal Bleeding  . Medication Refill    HPI Gina Mcclure is a 43 y.o. female.     HPI Level 5 caveat secondary to language barrier History obtained through interpreter 43 year old female presents today complaining of being out of medications.  She is takes medication for diabetes 500 mg of oral medication.  She is unable to tell me the name.  She denies any symptoms of frequency of urination, frequent thirst or hunger, chest pain, dyspnea, fever, cough, chills.  She denies any recent travel or exposure to carbon 19 patients.  She also states that she has had some ongoing vaginal bleeding since a miscarriage 3 months ago.  She describes it as being approximately 10 days/month without heavy bleeding.  She denies any associated lightheadedness or weakness.  She denies any pelvic pain or abnormal vaginal discharge.  She has not followed up with her primary care or gynecologist.  Past Medical History:  Diagnosis Date  . Diabetes mellitus without complication (HCC)     There are no active problems to display for this patient.   Past Surgical History:  Procedure Laterality Date  . CESAREAN SECTION       OB History    Gravida  6   Para  4   Term  4   Preterm      AB  1   Living  4     SAB  1   TAB      Ectopic      Multiple      Live Births               Home Medications    Prior to Admission medications   Medication Sig Start Date End Date Taking? Authorizing Provider  promethazine (PHENERGAN) 25 MG tablet Take 0.5-1 tablets (12.5-25 mg total) by mouth every 6 (six) hours as needed for nausea. 07/24/18   Leftwich-Kirby, Wilmer Floor, CNM  traMADol (ULTRAM) 50 MG tablet Take 1 tablet (50 mg total) by mouth every 6 (six) hours as needed. 07/24/18   Leftwich-Kirby, Wilmer Floor, CNM     Family History History reviewed. No pertinent family history.  Social History Social History   Tobacco Use  . Smoking status: Never Smoker  . Smokeless tobacco: Never Used  Substance Use Topics  . Alcohol use: Not Currently  . Drug use: Never     Allergies   Patient has no known allergies.   Review of Systems Review of Systems  All other systems reviewed and are negative.    Physical Exam Updated Vital Signs BP 134/87   Pulse 84   Temp 98.7 F (37.1 C) (Oral)   Resp 14   LMP 05/08/2018 (Approximate)   SpO2 99%   Breastfeeding Unknown   Physical Exam Vitals signs and nursing note reviewed.  Constitutional:      Appearance: Normal appearance.  HENT:     Head: Normocephalic and atraumatic.     Right Ear: External ear normal.     Left Ear: External ear normal.     Nose: Nose normal.     Mouth/Throat:     Mouth: Mucous membranes are moist.     Pharynx: Oropharynx is clear.  Eyes:     Pupils: Pupils are equal, round, and reactive to light.  Neck:  Musculoskeletal: Normal range of motion.  Cardiovascular:     Rate and Rhythm: Normal rate and regular rhythm.  Pulmonary:     Effort: Pulmonary effort is normal.     Breath sounds: Normal breath sounds.  Abdominal:     General: Abdomen is flat. Bowel sounds are normal. There is no distension.     Palpations: Abdomen is soft.     Tenderness: There is no abdominal tenderness.  Musculoskeletal: Normal range of motion.  Skin:    General: Skin is warm and dry.     Capillary Refill: Capillary refill takes less than 2 seconds.  Neurological:     General: No focal deficit present.     Mental Status: She is alert.  Psychiatric:        Mood and Affect: Mood normal.      ED Treatments / Results  Labs (all labs ordered are listed, but only abnormal results are displayed) Labs Reviewed  CBC - Abnormal; Notable for the following components:      Result Value   Hemoglobin 10.6 (*)    HCT 35.1 (*)    MCH  25.4 (*)    All other components within normal limits  CBG MONITORING, ED - Abnormal; Notable for the following components:   Glucose-Capillary 133 (*)    All other components within normal limits  I-STAT BETA HCG BLOOD, ED ([facility], [facility], [facility] ONLY)    EKG None  Radiology No results found.  Procedures Procedures (including critical care time)  Medications Ordered in ED Medications - No data to display   Initial Impression / Assessment and Plan / ED Course  I have reviewed the triage vital signs and the nursing notes.  Pertinent labs & imaging results that were available during my care of the patient were reviewed by me and considered in my medical decision making (see chart for details).        43 year old female presents today with her medications.  Her blood sugar here is 133.  Awaiting pharmacy to find out which medications she is out of and will fill these. Patient is not pregnant.  Her hemoglobin is low at 10.5.  She is not tachycardic or hypotensive.  Plan to start iron and have patient follow-up with her primary care doctor.  She is advised regarding need to follow-up with gynecologist that she has had symptoms ongoing since her last miscarriage.  Final Clinical Impressions(s) / ED Diagnoses   Final diagnoses:  Abnormal vaginal bleeding  Has run out of medications    ED Discharge Orders    None       Margarita Grizzle, MD 10/17/18 1351

## 2018-10-17 NOTE — ED Triage Notes (Signed)
Pt needs metformin RX and she has had vaginal bleeding longer than normal. Recent miscarriage.

## 2018-10-17 NOTE — ED Notes (Addendum)
PT husband would like to be updated. [phone] Husband -Osama

## 2018-10-17 NOTE — ED Notes (Signed)
Waiting on Pharm tech to review meds.

## 2018-10-17 NOTE — ED Notes (Signed)
This RN used interpreter to go over pt med with pharmacy on the phone. This RN instructed pharm tech to call to pharm to verify the "blood sugar medicine" that the pt was unable to name. The pharm tech returned the call and confirmed metformin 500mg  BID.

## 2018-10-17 NOTE — ED Notes (Signed)
Patient verbalizes understanding of discharge instructions. Opportunity for questioning and answers were provided. Armband removed by staff, pt discharged from ED.  

## 2018-10-17 NOTE — Discharge Instructions (Signed)
Please call your gynecologist on Monday for recheck Please start iron Medications are being refilled, please take as prescribed

## 2019-12-10 IMAGING — US US OB TRANSVAGINAL
1 series · 15 of 28 positions shown · non-contrast
Comparison: 07/18/2018

CLINICAL DATA: Vaginal bleeding, early pregnancy

EXAM:
TRANSVAGINAL OB ULTRASOUND
TECHNIQUE: Transvaginal ultrasound was performed for complete evaluation of the
gestation as well as the maternal uterus, adnexal regions, and
pelvic cul-de-sac.

[Series 1: us ob transvaginal · 50 acquisitions, 15 frames shown]
[im 1/50]
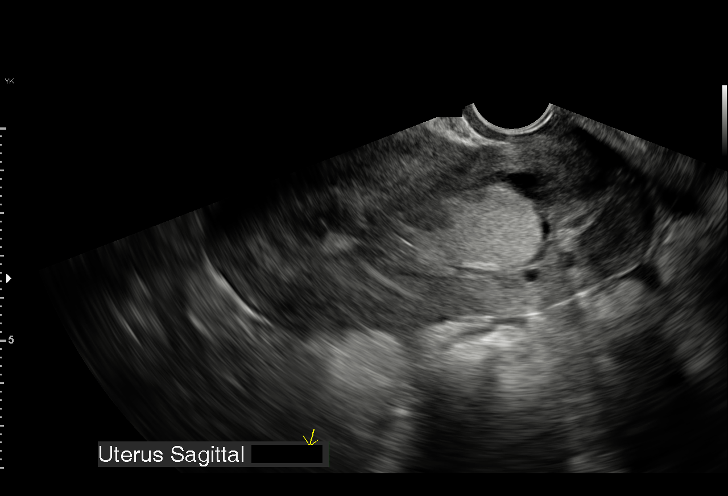
[im 4/50]
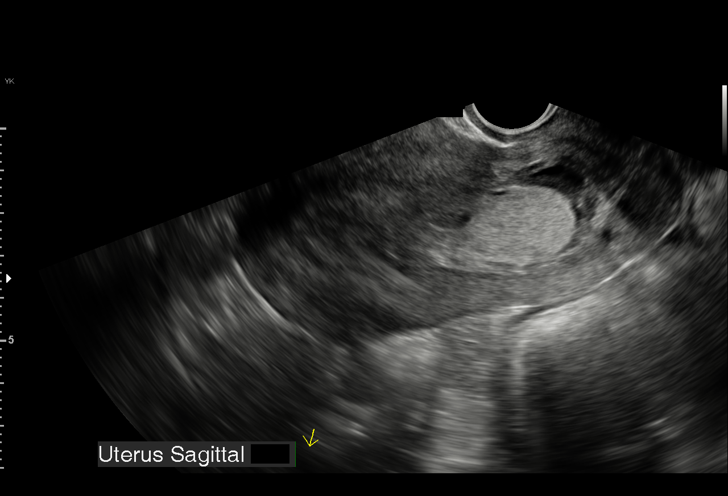
[im 8/50]
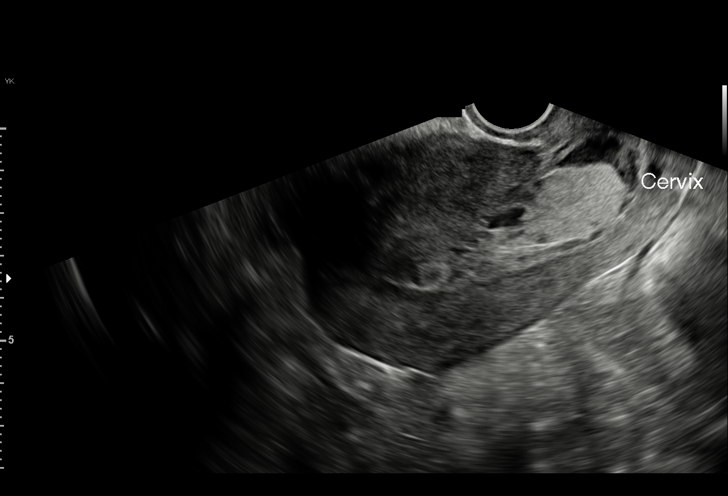
[im 11/50]
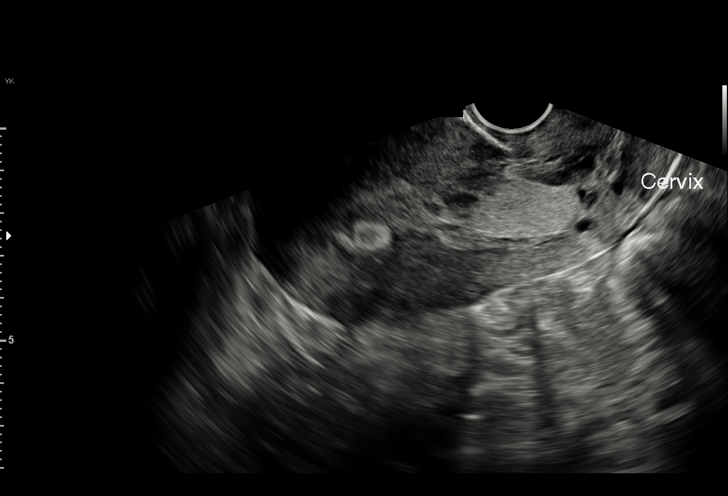
[im 15/50]
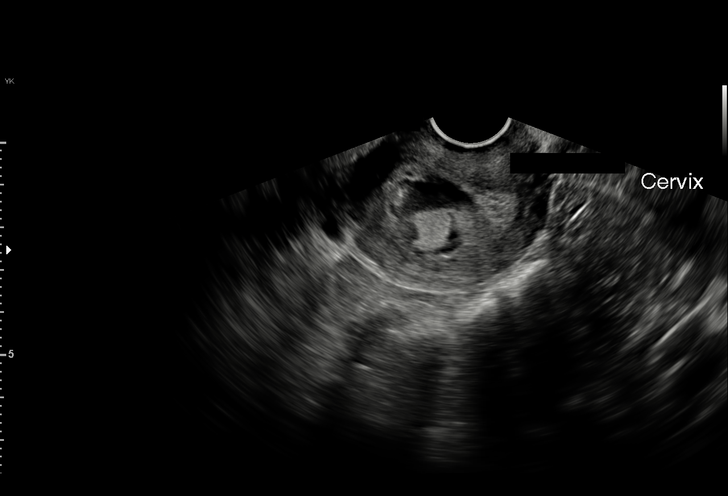
[im 19/50]
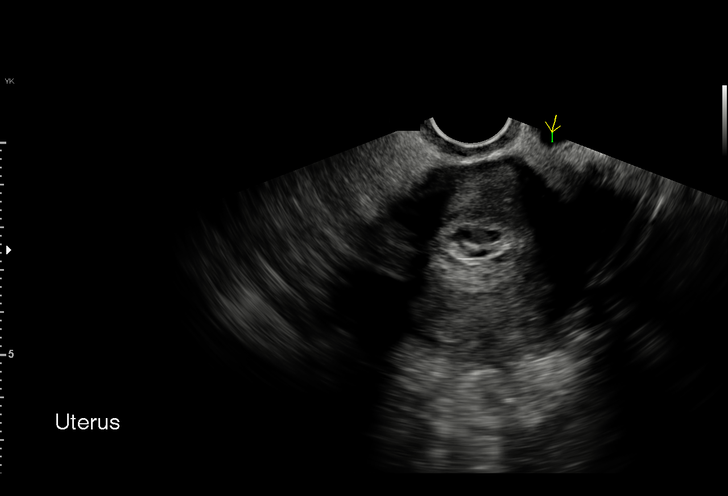
[im 22/50]
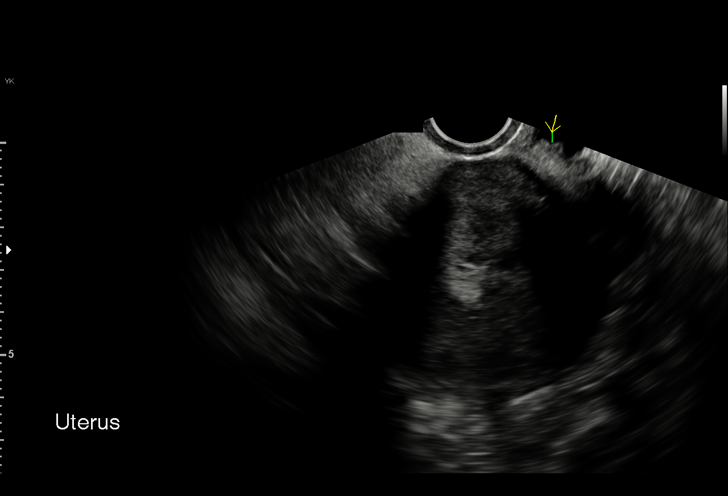
[im 26/50]
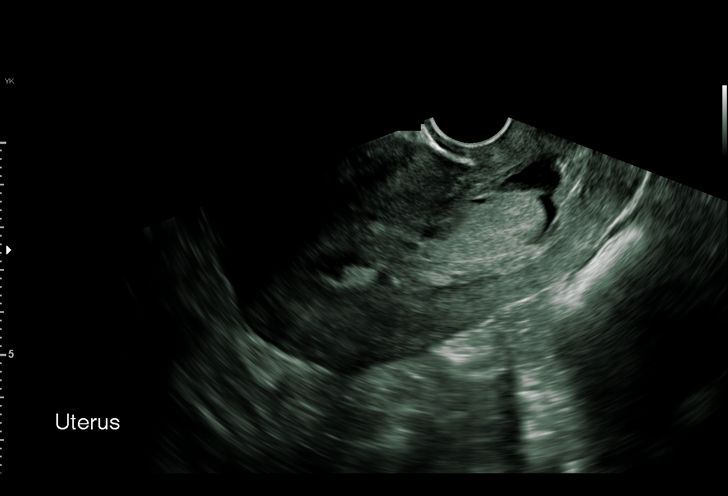
[im 28/50]
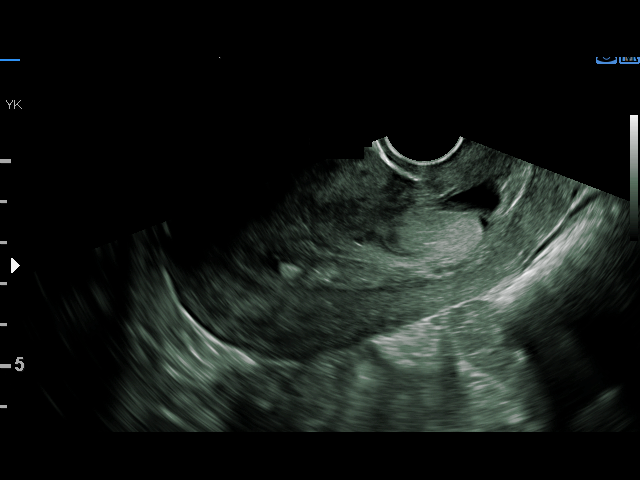
[im 31/50]
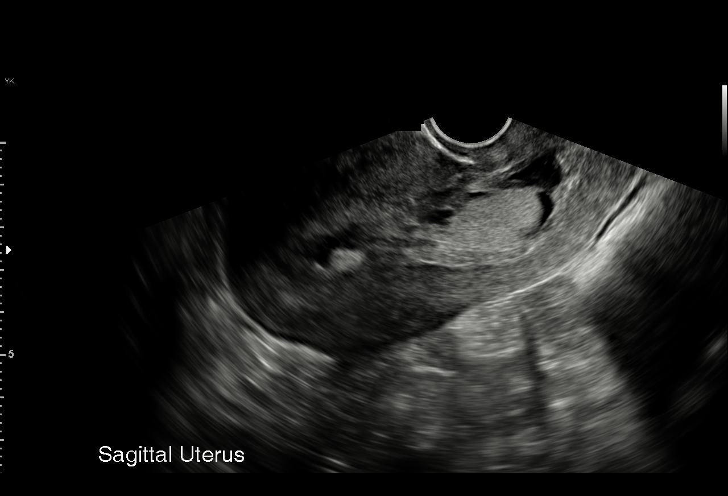
[im 35/50]
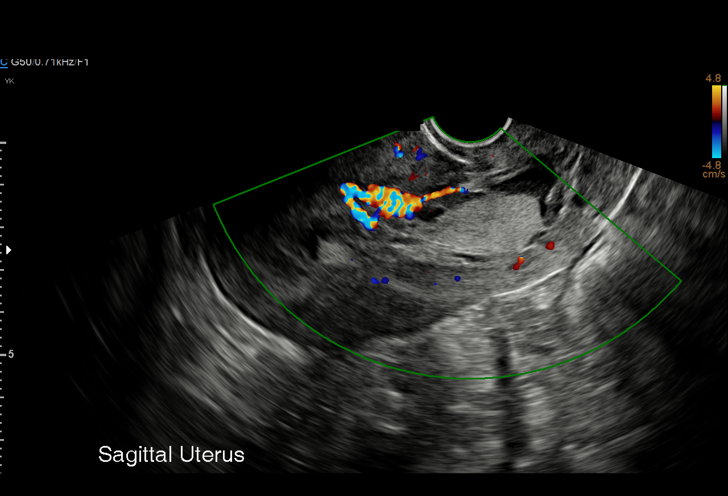
[im 39/50]
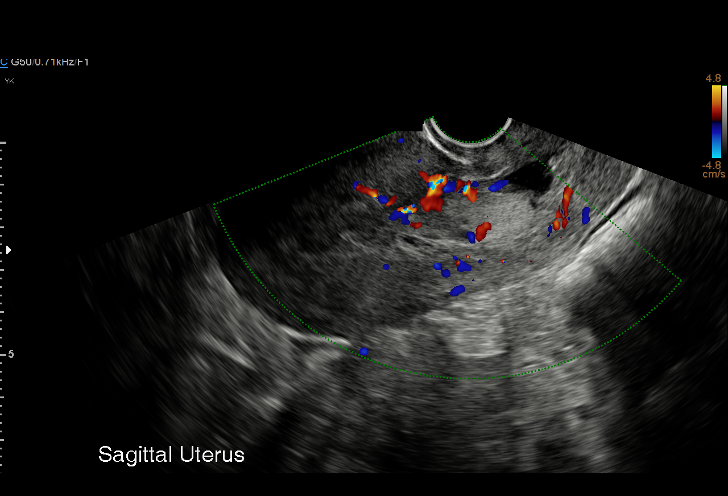
[im 42/50]
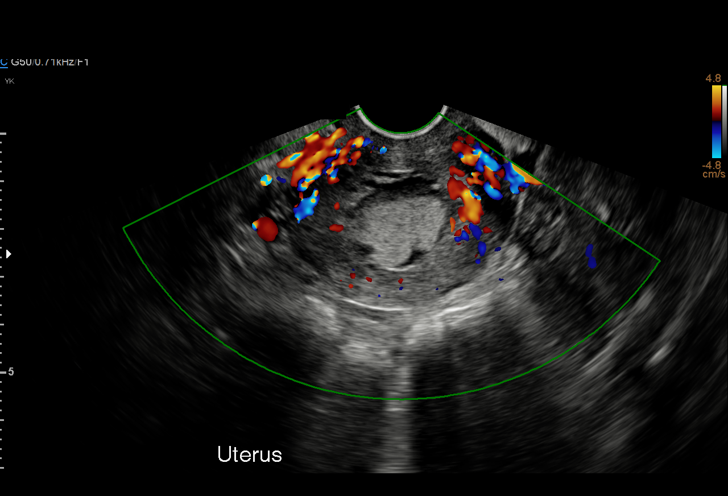
[im 46/50]
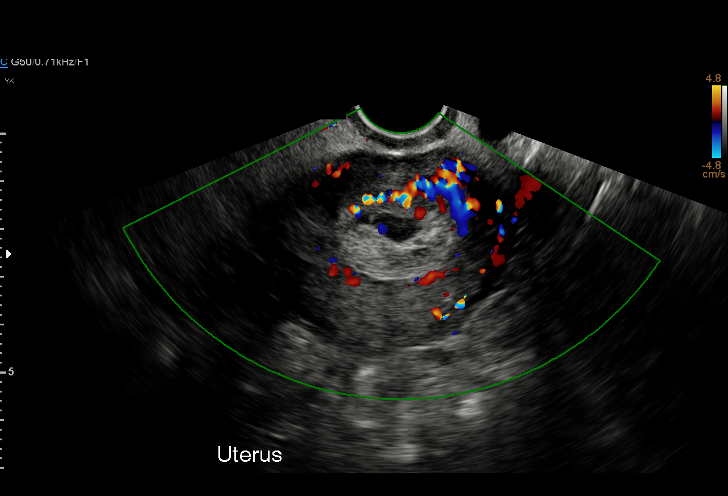
[im 50/50]
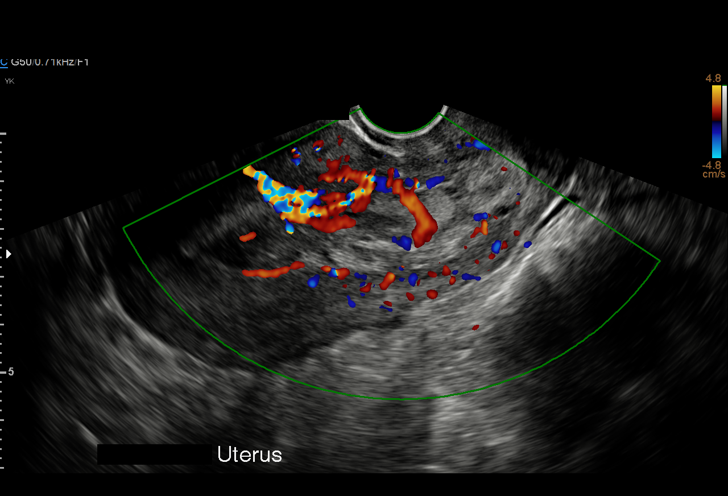

[15 of 28 positions shown; findings below may reference images not displayed]

FINDINGS: Intrauterine gestational sac: Absent

Yolk sac:  Absent

Embryo:  Absent

Cardiac Activity: Absent

Heart Rate: N/A bpm

Mixed echogenicity material along the endometrium primarily
compatible with blood products. Small amount of accentuated
vascularity along some of the endometrial contents. These contents
bulge down into the cervix.

Maternal uterus/adnexae: The maternal adnexa could not be visualized
as the patient was in considerable pain/discomfort which limited
today's scan.
IMPRESSION: 1. No recognizable gestational sac. Considerable mixed echogenicity
material including hyperechoic material within the endometrium
compatible with blood products. Overall appearance is not compatible
with a viable pregnancy and represents spontaneous abortion in
progress.

## 2019-12-31 ENCOUNTER — Ambulatory Visit (HOSPITAL_COMMUNITY)
Admission: EM | Admit: 2019-12-31 | Discharge: 2019-12-31 | Disposition: A | Discharge status: Home / Self Care | Payer: Medicaid Other | Attending: Emergency Medicine | Admitting: Emergency Medicine

## 2019-12-31 ENCOUNTER — Other Ambulatory Visit: Payer: Self-pay

## 2019-12-31 ENCOUNTER — Encounter (HOSPITAL_COMMUNITY): Payer: Self-pay

## 2019-12-31 DIAGNOSIS — R05 Cough: Secondary | ICD-10-CM

## 2019-12-31 DIAGNOSIS — N939 Abnormal uterine and vaginal bleeding, unspecified: Secondary | ICD-10-CM | POA: Diagnosis present

## 2019-12-31 DIAGNOSIS — R059 Cough, unspecified: Secondary | ICD-10-CM

## 2019-12-31 DIAGNOSIS — Z3202 Encounter for pregnancy test, result negative: Secondary | ICD-10-CM

## 2019-12-31 LAB — BASIC METABOLIC PANEL
Anion gap: 11 (ref 5–15)
BUN: 9 mg/dL (ref 6–20)
CO2: 22 mmol/L (ref 22–32)
Calcium: 9.2 mg/dL (ref 8.9–10.3)
Chloride: 104 mmol/L (ref 98–111)
Creatinine, Ser: 0.55 mg/dL (ref 0.44–1.00)
GFR calc Af Amer: >60 mL/min (ref >60)
GFR calc non Af Amer: >60 mL/min (ref >60)
Glucose, Bld: 95 mg/dL (ref 70–99)
Potassium: 4.3 mmol/L (ref 3.5–5.1)
Sodium: 137 mmol/L (ref 135–145)

## 2019-12-31 LAB — POCT URINALYSIS DIP (DEVICE)
Bilirubin Urine: NEGATIVE
Glucose, UA: NEGATIVE mg/dL
Ketones, ur: NEGATIVE mg/dL
Nitrite: NEGATIVE
Protein, ur: 30 mg/dL — AB
Specific Gravity, Urine: >=1.03 (ref 1.005–1.030)
Urobilinogen, UA: 0.2 mg/dL (ref 0.0–1.0)
pH: 5 (ref 5.0–8.0)

## 2019-12-31 LAB — CBC
HCT: 36.7 % (ref 36.0–46.0)
Hemoglobin: 11 g/dL — ABNORMAL LOW (ref 12.0–15.0)
MCH: 23.1 pg — ABNORMAL LOW (ref 26.0–34.0)
MCHC: 30 g/dL (ref 30.0–36.0)
MCV: 77.1 fL — ABNORMAL LOW (ref 80.0–100.0)
Platelets: 424 10*3/uL — ABNORMAL HIGH (ref 150–400)
RBC: 4.76 MIL/uL (ref 3.87–5.11)
RDW: 15.2 % (ref 11.5–15.5)
WBC: 7 10*3/uL (ref 4.0–10.5)
nRBC: 0 % (ref 0.0–0.2)

## 2019-12-31 LAB — POC URINE PREG, ED: Preg Test, Ur: NEGATIVE

## 2019-12-31 MED ORDER — BENZONATATE 200 MG PO CAPS: 200.0000 mg | ORAL_CAPSULE | Freq: Three times a day (TID) | ORAL | 0 refills | Status: AC | PRN

## 2019-12-31 MED ORDER — CETIRIZINE HCL 10 MG PO CAPS
10.0000 mg | ORAL_CAPSULE | Freq: Every day | ORAL | 0 refills | Status: AC
Start: 2019-12-31 — End: ?

## 2019-12-31 NOTE — Discharge Instructions (Addendum)
Blood work pending- I will only call if abnormal Please follow up with OBGYN if continuing to have long cycles/abnormal bleeding  Begin daily cetirizine to help with throat irritation any drainage that is triggering cough-please use consistently over the next 2 weeks as a trial May use Tessalon/benzonatate every 8 hours as needed for cough  Please follow-up if cough or breathing worsening, changing, developing worsening bleeding, lightheadedness, dizziness, abdominal pain

## 2019-12-31 NOTE — ED Triage Notes (Signed)
Patient complains of light vaginal bleeding x2 weeks as well as seeing clots when urinating.  Also c/o cough when she smells perfume or spices.

## 2019-12-31 NOTE — ED Provider Notes (Signed)
[facility]    CSN: [id number] Arrival date & time: 12/31/19  1100      History   Chief Complaint Chief Complaint  Patient presents with  . abnormal period  . 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 female.   History of diabetes presenting today for evaluation of vaginal bleeding and cough.  Patient reports that for the past 2 weeks she has had persistent vaginal bleeding.  Occasionally seeing clots.  She denies associated pain.  Typical menstrual cycle is approximately 1 week.  Mainly sees clots more so with urination, denies associated urinary symptoms of dysuria, increased frequency or urgency.  Reports she has frequency at baseline due to her diabetes.  Denies increased from her normal.  Denies abnormal discharge.  Is not on birth control.  Does not have an OB/GYN.  Denies nausea or vomiting.  She also expresses concern about her kidney.  She also reports over the past 6 months she has had a cough that she notices especially with new smells new places.  Will have throat irritation and mild sore throat related to the cough.  Denies fevers.  Denies nasal congestion.  Cough occasionally productive.  Denies chest pain or shortness of breath. HPI  Past Medical History:  Diagnosis Date  . Diabetes mellitus without complication (HCC)     There are no problems to display for this patient.   Past Surgical History:  Procedure Laterality Date  . CESAREAN SECTION      OB History    Gravida  6   Para  4   Term  4   Preterm      AB  1   Living  4     SAB  1   TAB      Ectopic      Multiple      Live Births               Home Medications    Prior to Admission medications   Medication Sig Start Date End Date Taking? Authorizing Provider  benzonatate (TESSALON) 200 MG capsule Take 1 capsule (200 mg total) by mouth 3 (three) times daily as needed for up to 7 days for cough. 12/31/19 01/07/20  Amra Shukla C, PA-C   Cetirizine HCl 10 MG CAPS Take 1 capsule (10 mg total) by mouth daily. 12/31/19   Jorie Zee C, PA-C  ferrous sulfate 325 (65 FE) MG tablet Take 1 tablet (325 mg total) by mouth daily. 10/17/18   Margarita Grizzle, MD  metFORMIN (GLUCOPHAGE) 500 MG tablet Take 1 tablet (500 mg total) by mouth 2 (two) times daily with a meal. 10/17/18   Margarita Grizzle, MD  metFORMIN (GLUCOPHAGE) 500 MG tablet Take 500 mg by mouth 2 (two) times daily with a meal.    [provider]  promethazine (PHENERGAN) 25 MG tablet Take 0.5-1 tablets (12.5-25 mg total) by mouth every 6 (six) hours as needed for nausea. Patient taking differently: Take 25 mg by mouth every 6 (six) hours as needed for nausea.  07/24/18   Leftwich-Kirby, Wilmer Floor, CNM  traMADol (ULTRAM) 50 MG tablet Take 1 tablet (50 mg total) by mouth every 6 (six) hours as needed. Patient not taking: Reported on 10/17/2018 07/24/18   Hurshel Party, CNM    Family History History reviewed. No pertinent family history.  Social History Social History   Tobacco Use  . Smoking status: Never Smoker  .  Smokeless tobacco: Never Used  Substance Use Topics  . Alcohol use: Not Currently  . Drug use: Never     Allergies   Patient has no known allergies.   Review of Systems Review of Systems  Constitutional: Negative for activity change, appetite change, chills, fatigue and fever.  HENT: Positive for sore throat. Negative for congestion, ear pain, rhinorrhea, sinus pressure and trouble swallowing.   Eyes: Negative for discharge and redness.  Respiratory: Positive for cough. Negative for chest tightness and shortness of breath.   Cardiovascular: Negative for chest pain.  Gastrointestinal: Negative for abdominal pain, diarrhea, nausea and vomiting.  Genitourinary: Positive for menstrual problem and vaginal bleeding. Negative for dysuria, flank pain, genital sores, hematuria, vaginal discharge and vaginal pain.  Musculoskeletal: Negative for back  pain and myalgias.  Skin: Negative for rash.  Neurological: Negative for dizziness, light-headedness and headaches.     Physical Exam Triage Vital Signs ED Triage Vitals  Enc Vitals Group     BP 12/31/19 1212 126/80     Pulse Rate 12/31/19 1212 85     Resp 12/31/19 1212 16     Temp 12/31/19 1212 98.4 F (36.9 C)     Temp Source 12/31/19 1212 Oral     SpO2 12/31/19 1212 100 %     Weight --      Height --      Head Circumference --      Peak Flow --      Pain Score 12/31/19 1209 0     Pain Loc --      Pain Edu? --      Excl. in [place]? --    No data found.  Updated Vital Signs BP 126/80 (BP Location: Right Arm)   Pulse 85   Temp 98.4 F (36.9 C) (Oral)   Resp 16   LMP 12/17/2019 (Within Days)   SpO2 100%   Visual Acuity Right Eye Distance:   Left Eye Distance:   Bilateral Distance:    Right Eye Near:   Left Eye Near:    Bilateral Near:     Physical Exam Vitals and nursing note reviewed.  Constitutional:      Appearance: She is well-developed.     Comments: No acute distress  HENT:     Head: Normocephalic and atraumatic.     Ears:     Comments: Bilateral ears without tenderness to palpation of external auricle, tragus and mastoid, EAC's without erythema or swelling, TM's with good bony landmarks and cone of light. Non erythematous.     Nose: Nose normal.     Mouth/Throat:     Comments: Oral mucosa pink and moist, no tonsillar enlargement or exudate. Posterior pharynx patent and nonerythematous, no uvula deviation or swelling. Normal phonation. Eyes:     Conjunctiva/sclera: Conjunctivae normal.  Cardiovascular:     Rate and Rhythm: Normal rate.  Pulmonary:     Effort: Pulmonary effort is normal. No respiratory distress.     Comments: Breathing comfortably at rest, CTABL, no wheezing, rales or other adventitious sounds auscultated Abdominal:     General: There is no distension.     Comments: Soft, nondistended, nontender light deep palpation throughout  abdomen  Genitourinary:    Comments: Normal external female genitalia, no external sources of bleeding noted, vaginal mucosa with mild amount of bright red blood present, seen exiting os, cervix nonerythematous, no cervical motion tenderness, no adnexal tenderness or masses palpated Musculoskeletal:        General: Normal  range of motion.     Cervical back: Neck supple.  Skin:    General: Skin is warm and dry.  Neurological:     Mental Status: She is alert and oriented to person, place, and time.      UC Treatments / Results  Labs (all labs ordered are listed, but only abnormal results are displayed) Labs Reviewed  POCT URINALYSIS DIP (DEVICE) - Abnormal; Notable for the following components:      Result Value   Hgb urine dipstick LARGE (*)    Protein, ur 30 (*)    Leukocytes,Ua TRACE (*)    All other components within normal limits  CBC  BASIC METABOLIC PANEL  POC URINE PREG, ED    EKG   Radiology No results found.  Procedures Procedures (including critical care time)  Medications Ordered in UC Medications - No data to display  Initial Impression / Assessment and Plan / UC Course  I have reviewed the triage vital signs and the nursing notes.  Pertinent labs & imaging results that were available during my care of the patient were reviewed by me and considered in my medical decision making (see chart for details).    Bleeding does seem to be uterine bleeding.  Most likely from urine/GU tract.  Given patient's concern about kidney we will go ahead and draw basic labs of BMP and CBC to check kidney function as well as hemoglobin.  Recommending follow-up with OB/GYN for further evaluation of abnormal bleeding, may need further imaging or uterine biopsy if continuing to have abnormal bleeding.  Cough seems likely irritated versus allergic.  Will initiate on daily cetirizine for 2-week trial, may supplement with Tessalon as needed.  Do not suspect pneumonia or underlying  infectious cause at this time.  Discussed strict return precautions. Patient verbalized understanding and is agreeable with plan.   Final Clinical Impressions(s) / UC Diagnoses   Final diagnoses:  Cough  Vaginal bleeding     Discharge Instructions     Blood work pending- I will only call if abnormal Please follow up with OBGYN if continuing to have long cycles/abnormal bleeding  Begin daily cetirizine to help with throat irritation any drainage that is triggering cough-please use consistently over the next 2 weeks as a trial May use Tessalon/benzonatate every 8 hours as needed for cough  Please follow-up if cough or breathing worsening, changing, developing worsening bleeding, lightheadedness, dizziness, abdominal pain      ED Prescriptions    Medication Sig Dispense Auth. Provider   benzonatate (TESSALON) 200 MG capsule Take 1 capsule (200 mg total) by mouth 3 (three) times daily as needed for up to 7 days for cough. 28 capsule Latrise Bowland C, PA-C   Cetirizine HCl 10 MG CAPS Take 1 capsule (10 mg total) by mouth daily. 20 capsule Annalynn Centanni, Ko Vaya C, PA-C     PDMP not reviewed this encounter.   Lew Dawes, [place] 12/31/19 1312
